# Patient Record
Sex: Male | Born: 1950 | Race: White | Hispanic: No | Marital: Single
Health system: Southern US, Community
[De-identification: ages and names within clinical notes are randomized; demographics above are authoritative.]

---

## 2013-12-29 ENCOUNTER — Other Ambulatory Visit (HOSPITAL_COMMUNITY): Payer: Self-pay | Admitting: *Deleted

## 2013-12-29 DIAGNOSIS — Z720 Tobacco use: Secondary | ICD-10-CM

## 2013-12-29 DIAGNOSIS — R9389 Abnormal findings on diagnostic imaging of other specified body structures: Secondary | ICD-10-CM

## 2014-01-01 ENCOUNTER — Ambulatory Visit (HOSPITAL_COMMUNITY)
Admission: RE | Admit: 2014-01-01 | Discharge: 2014-01-01 | Disposition: A | Discharge status: Home / Self Care | Payer: Medicare Other | Source: Ambulatory Visit | Attending: Nurse Practitioner | Admitting: Nurse Practitioner

## 2014-01-01 DIAGNOSIS — R059 Cough, unspecified: Secondary | ICD-10-CM | POA: Insufficient documentation

## 2014-01-01 DIAGNOSIS — R0989 Other specified symptoms and signs involving the circulatory and respiratory systems: Secondary | ICD-10-CM | POA: Insufficient documentation

## 2014-01-01 DIAGNOSIS — R079 Chest pain, unspecified: Secondary | ICD-10-CM | POA: Insufficient documentation

## 2014-01-01 DIAGNOSIS — W19XXXA Unspecified fall, initial encounter: Secondary | ICD-10-CM | POA: Insufficient documentation

## 2014-01-01 DIAGNOSIS — R9389 Abnormal findings on diagnostic imaging of other specified body structures: Secondary | ICD-10-CM

## 2014-01-01 DIAGNOSIS — R05 Cough: Secondary | ICD-10-CM | POA: Insufficient documentation

## 2014-01-01 DIAGNOSIS — R937 Abnormal findings on diagnostic imaging of other parts of musculoskeletal system: Secondary | ICD-10-CM | POA: Insufficient documentation

## 2014-01-01 DIAGNOSIS — Z720 Tobacco use: Secondary | ICD-10-CM

## 2015-12-20 ENCOUNTER — Encounter: Payer: Self-pay | Admitting: *Deleted

## 2016-06-08 ENCOUNTER — Other Ambulatory Visit (HOSPITAL_COMMUNITY): Payer: Self-pay | Admitting: Nurse Practitioner

## 2016-06-08 DIAGNOSIS — Z0000 Encounter for general adult medical examination without abnormal findings: Secondary | ICD-10-CM

## 2016-07-26 ENCOUNTER — Other Ambulatory Visit (HOSPITAL_COMMUNITY): Payer: Self-pay | Admitting: Nurse Practitioner

## 2016-07-26 ENCOUNTER — Other Ambulatory Visit: Payer: Self-pay | Admitting: Nurse Practitioner

## 2016-07-26 DIAGNOSIS — K7689 Other specified diseases of liver: Secondary | ICD-10-CM

## 2016-08-01 ENCOUNTER — Ambulatory Visit (HOSPITAL_COMMUNITY): Payer: Self-pay

## 2016-08-01 ENCOUNTER — Telehealth (HOSPITAL_COMMUNITY): Payer: Self-pay | Admitting: *Deleted

## 2016-08-01 NOTE — Telephone Encounter (Signed)
spoke with patient, he said he has liver cancer and he has other things he is concerned with right now.   He said he won't be needing appointment here.

## 2016-08-03 ENCOUNTER — Ambulatory Visit (HOSPITAL_COMMUNITY)
Admission: RE | Admit: 2016-08-03 | Discharge: 2016-08-03 | Disposition: A | Discharge status: Home / Self Care | Payer: Medicare Other | Source: Ambulatory Visit | Attending: Nurse Practitioner | Admitting: Nurse Practitioner

## 2016-08-03 DIAGNOSIS — K7689 Other specified diseases of liver: Secondary | ICD-10-CM | POA: Insufficient documentation

## 2016-08-03 DIAGNOSIS — K824 Cholesterolosis of gallbladder: Secondary | ICD-10-CM | POA: Diagnosis not present

## 2018-04-21 IMAGING — US US ABDOMEN COMPLETE
1 series · 14 of 25 positions shown · non-contrast
Comparison: CT 01/01/2014.

CLINICAL DATA: Hepatic cyst follow-up.

EXAM:
ABDOMEN ULTRASOUND COMPLETE

[Series 1: us abdomen complete · 0.22mm/px · 14 of 91 slices shown]
[im 1/91]
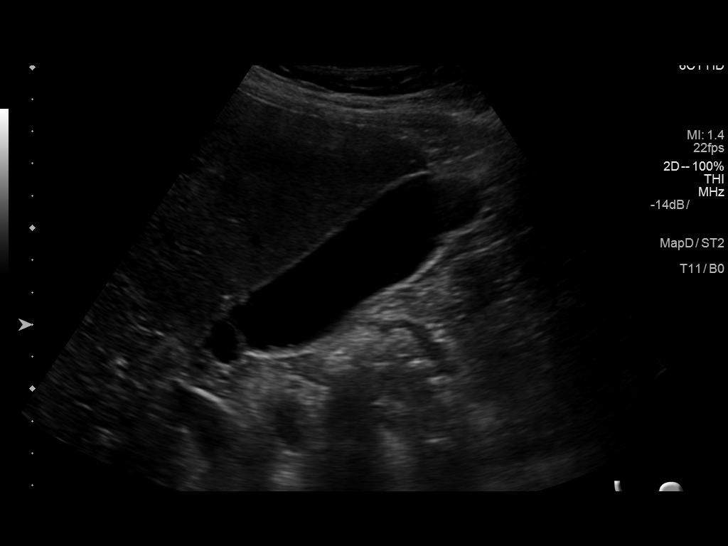
[im 8/91]
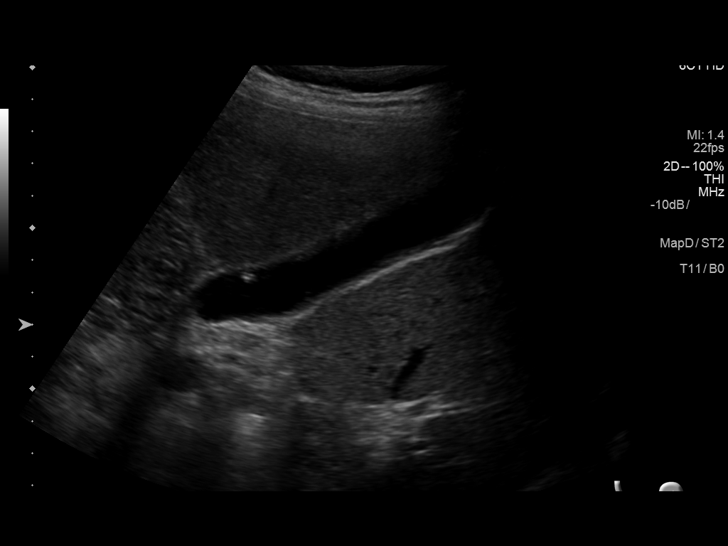
[im 16/91]
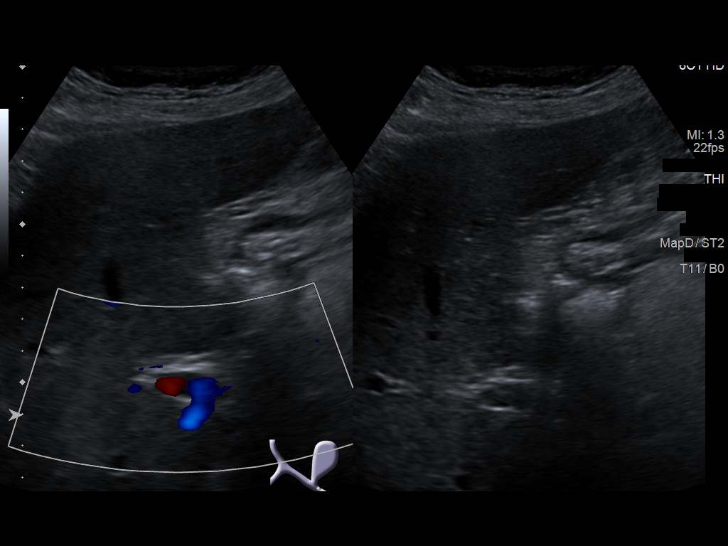
[im 23/91]
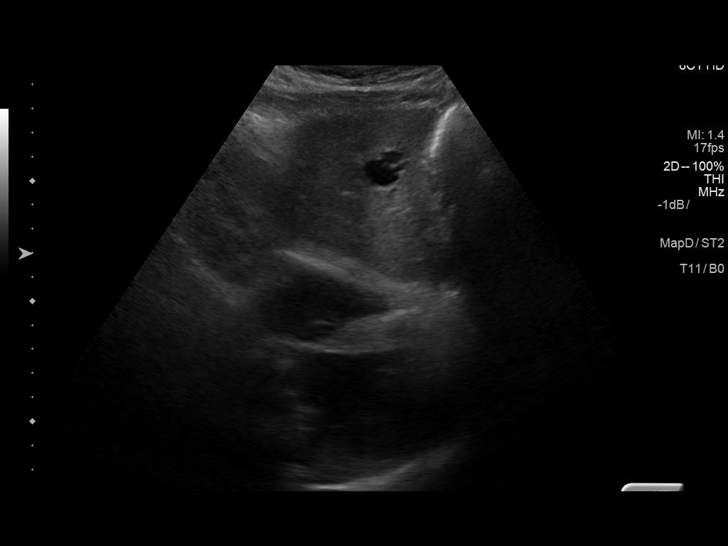
[im 31/91]
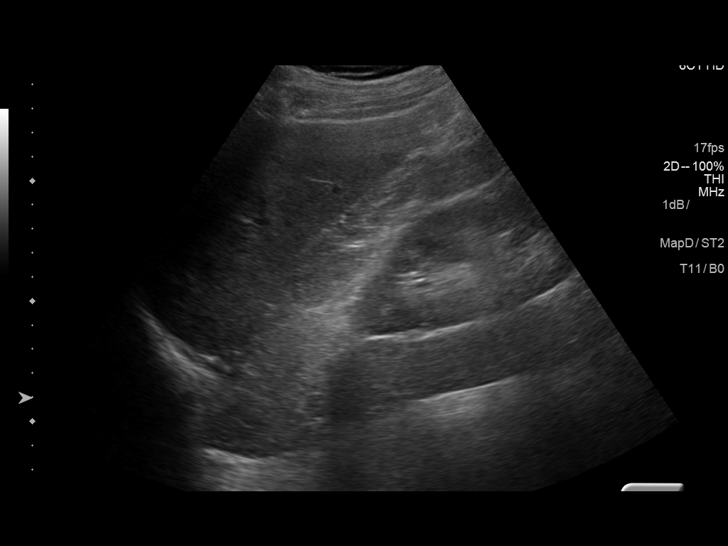
[im 34/91]
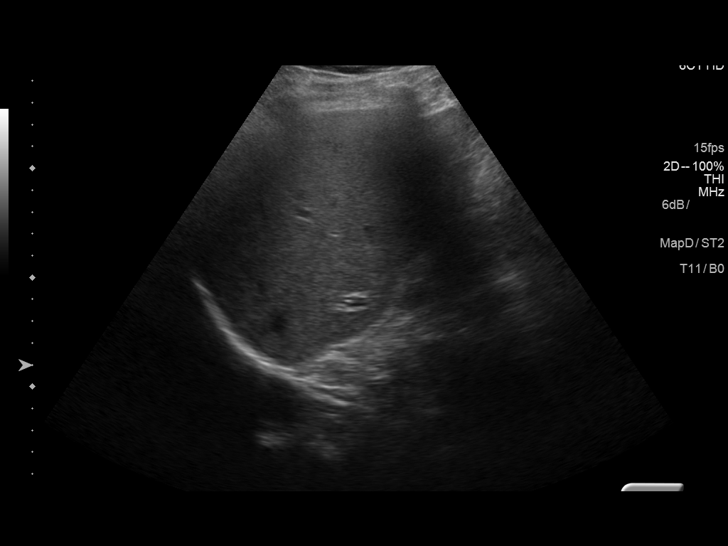
[im 42/91]
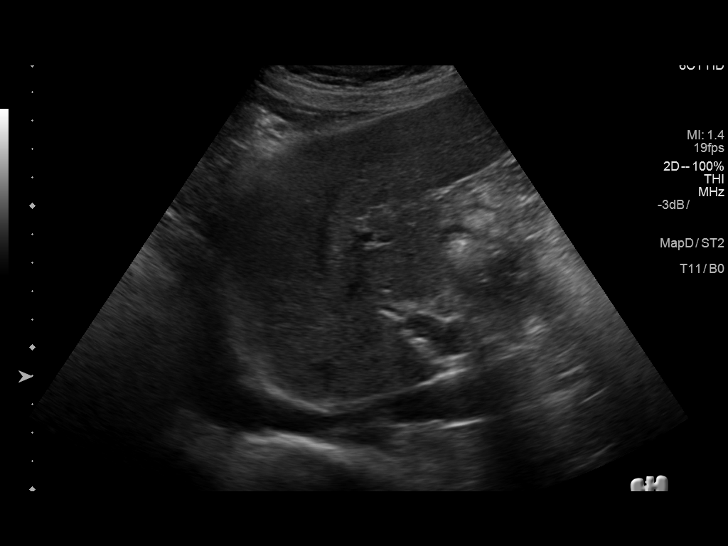
[im 49/91]
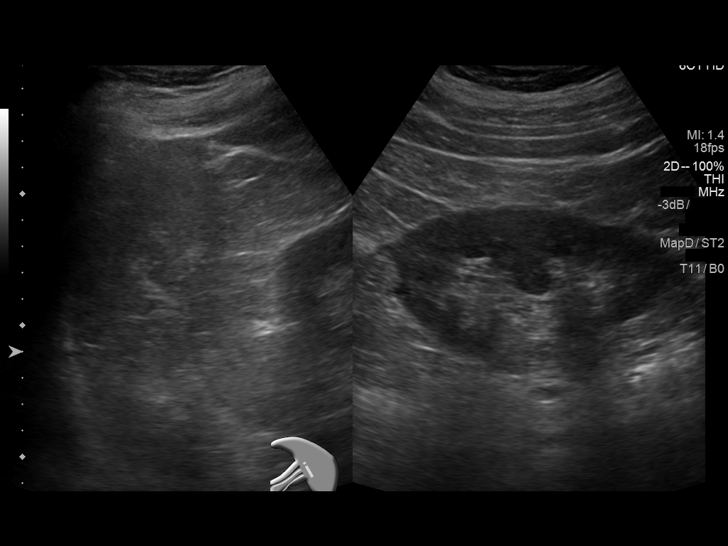
[im 57/91]
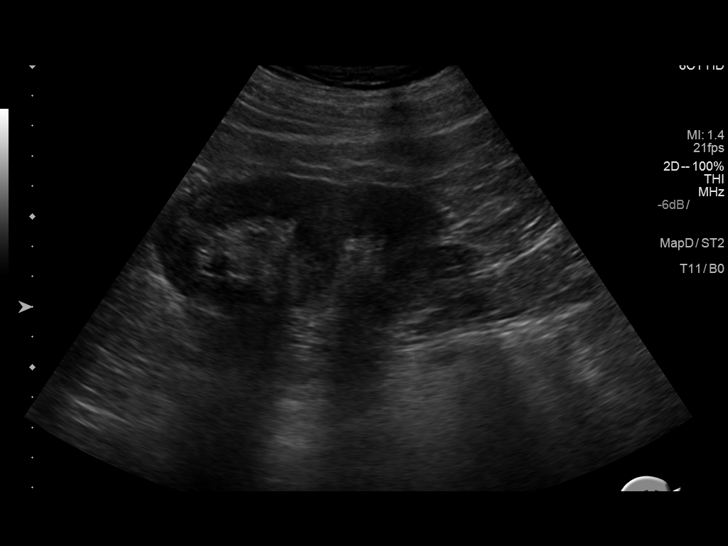
[im 61/91]
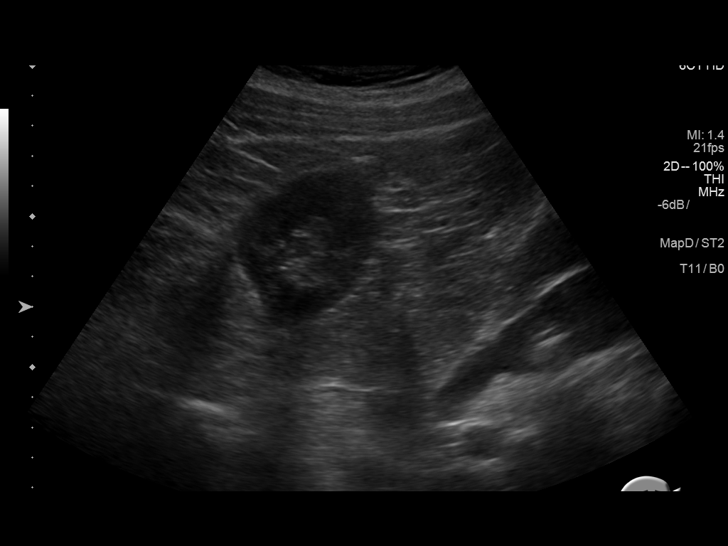
[im 68/91]
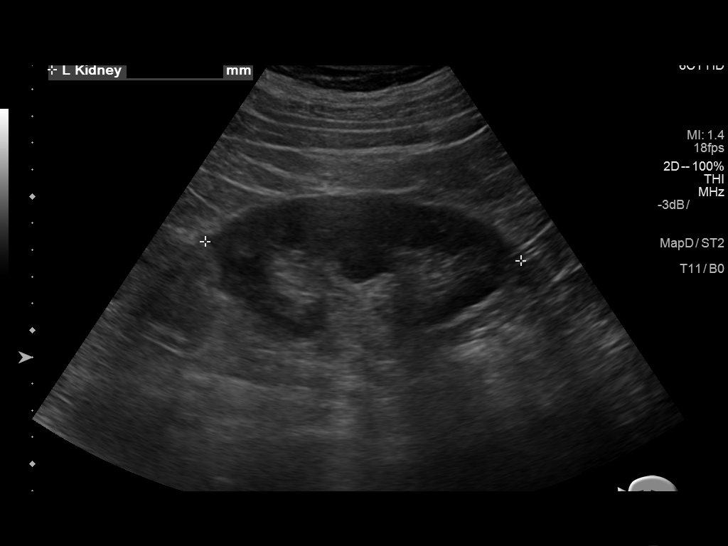
[im 76/91]
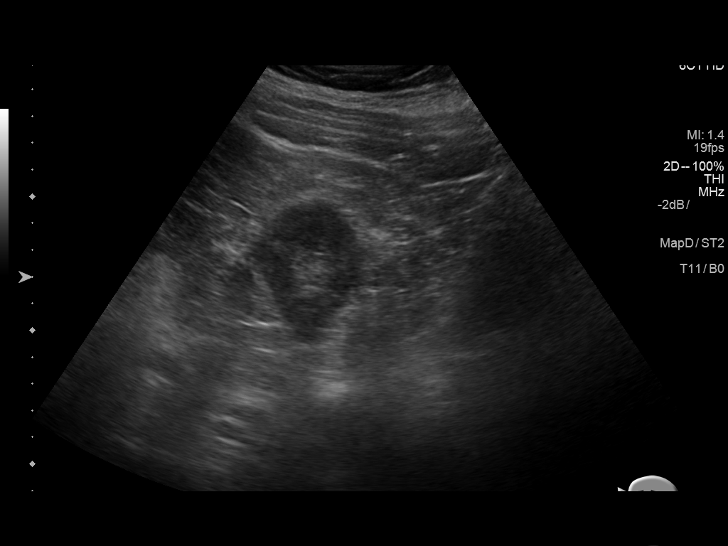
[im 83/91]
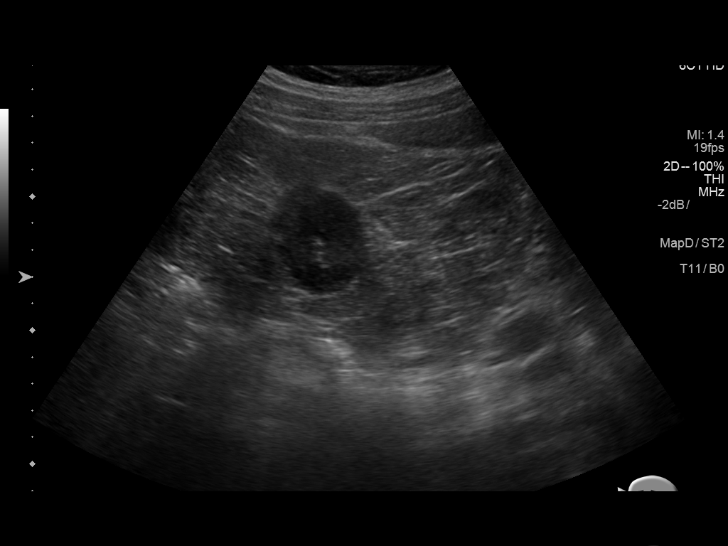
[im 91/91]
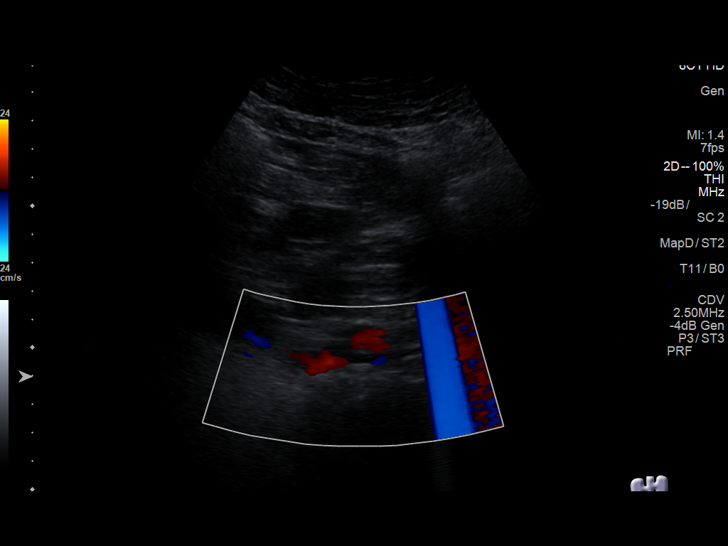

[14 of 25 positions shown; findings below may reference images not displayed]

FINDINGS: Gallbladder: Tiny 6 mm non shadowing non mobile echodensity noted
along the anterior gallbladder wall consistent with a polyp. No
gallstones. Negative Murphy sign.

Common bile duct: Diameter: 4 mm

Liver: Liver echotexture is normal. 1.9 cm and 2.0 cm cysts with
thin septations noted in the anterior aspect of the left lobe liver.

IVC: No abnormality visualized.

Pancreas: Visualized portion unremarkable.

Spleen: Size and appearance within normal limits.

Right Kidney: Length: 11.4 cm. Echogenicity within normal limits. No
mass or hydronephrosis visualized.

Left Kidney: Length: 11.8 cm. Echogenicity within normal limits. No
mass or hydronephrosis visualized.

Abdominal aorta: No aneurysm visualized.

Other findings: None.
IMPRESSION: 1. 6 mm gallbladder polyp.  No gallstones or biliary distention.

2. 2 small septated cysts in the left lobe liver again noted. These
are most likely benign. Similar findings noted on prior CT of
01/01/2014 .

## 2021-09-21 ENCOUNTER — Encounter: Payer: Medicare Other | Admitting: Podiatry

## 2021-09-21 ENCOUNTER — Ambulatory Visit: Payer: Medicare Other

## 2021-09-22 NOTE — Progress Notes (Signed)
This encounter was created in error - please disregard.

## 2023-07-10 ENCOUNTER — Ambulatory Visit: Payer: Medicare Other | Admitting: Podiatry

## 2023-07-10 DIAGNOSIS — Z0000 Encounter for general adult medical examination without abnormal findings: Secondary | ICD-10-CM

## 2023-07-10 DIAGNOSIS — Q666 Other congenital valgus deformities of feet: Secondary | ICD-10-CM | POA: Diagnosis not present

## 2023-07-10 NOTE — Progress Notes (Signed)
Subjective: Ryan Rocha presents today referred by Erasmo Downer, NP for  foot evaluation.    Patient denies any history of foot wounds.  Patient denies any history of numbness, tingling, burning, pins/needles sensations.  No past medical history on file.  There are no problems to display for this patient.     No current outpatient medications on file prior to visit.   No current facility-administered medications on file prior to visit.     Not on File  Social History   Occupational History   Not on file  Tobacco Use   Smoking status: Not on file   Smokeless tobacco: Not on file  Substance and Sexual Activity   Alcohol use: Not on file   Drug use: Not on file   Sexual activity: Not on file    No family history on file.   There is no immunization history on file for this patient.  Review of systems: Positive Findings in bold print.  Constitutional:  chills, fatigue, fever, sweats, weight change Communication: Nurse, learning disability, sign Presenter, broadcasting, hand writing, iPad/Android device Head: headaches, head injury Eyes: changes in vision, eye pain, glaucoma, cataracts, macular degeneration, diplopia, glare,  light sensitivity, eyeglasses or contacts, blindness Ears nose mouth throat: hearing impaired, hearing aids,  ringing in ears, deaf, sign language,  vertigo, nosebleeds,  rhinitis,  cold sores, snoring, swollen glands Cardiovascular: HTN, edema, arrhythmia, pacemaker in place, defibrillator in place, chest pain/tightness, chronic anticoagulation, blood clot, heart failure, MI Peripheral Vascular: leg cramps, varicose veins, blood clots, lymphedema, varicosities Respiratory:  asthma, difficulty breathing, denies congestion, SOB, wheezing, cough, emphysema Gastrointestinal: change in appetite or weight, abdominal pain, constipation, diarrhea, nausea, vomiting, vomiting blood, change in bowel habits, abdominal pain, jaundice, rectal bleeding, hemorrhoids,  GERD Genitourinary:  nocturia,  pain on urination, polyuria,  blood in urine, Foley catheter, urinary urgency, ESRD on hemodialysis Musculoskeletal: amputation, cramping, stiff joints, painful joints, decreased joint motion, fractures, OA, gout, hemiplegia, paraplegia, uses cane, wheelchair bound, uses walker, uses rollator Skin: +changes in toenails, color change, dryness, itching, mole changes,  rash, wound(s) Neurological: headaches, numbness in feet, paresthesias in feet, burning in feet, fainting,  seizures, change in speech, migraines, memory problems/poor historian, cerebral palsy, weakness, paralysis, CVA, TIA Endocrine: diabetes, hypothyroidism, hyperthyroidism,  goiter, dry mouth, flushing, heat intolerance, cold intolerance,  excessive thirst, denies polyuria,  nocturia Hematological:  easy bleeding, excessive bleeding, easy bruising, enlarged lymph nodes, on long term blood thinner, history of past transusions Allergy/immunological:  hives, eczema, frequent infections, multiple drug allergies, seasonal allergies, transplant recipient, multiple food allergies Psychiatric:  anxiety, depression, mood disorder, suicidal ideations, hallucinations, insomnia  Objective: There were no vitals filed for this visit. Vascular Examination: Capillary refill time less than 3 seconds x 10 digits.  Dorsalis pedis pulses less than 3 seconds.  Posterior tibial pulses less than 3 seconds.  Digital hair not present x 10 digits.  Skin temperature gradient WNL b/l.  Dermatological Examination: Skin with normal turgor, texture and tone b/l  Toenails 1-5 b/l discolored, thick, dystrophic with subungual debris and pain with palpation to nailbeds due to thickness of nails.  Musculoskeletal: Muscle strength 5/5 to all LE muscle groups.  Pes planus foot structure noted without any deformities  Neurological: Sensation intact with 10 gram monofilament.  Vibratory sensation intact.  Assessment: Pes  planovalgus Encounter for foot examination  Plan: Discussed diabetic foot care principles. Literature dispensed on today. Patient to continue soft, supportive shoe gear daily. Patient to report any pedal injuries to medical  professional immediately. Follow up one year. Patient/POA to call should there be a concern in the interim.

## 2023-08-09 ENCOUNTER — Ambulatory Visit: Payer: Medicare Other | Admitting: Podiatry

## 2023-08-09 DIAGNOSIS — Z0000 Encounter for general adult medical examination without abnormal findings: Secondary | ICD-10-CM

## 2023-08-09 NOTE — Progress Notes (Signed)
Subjective: Ryan Rocha presents today referred by Erasmo Downer, NP for  foot evaluation.    Patient denies any history of foot wounds.  Patient denies any history of numbness, tingling, burning, pins/needles sensations.  No past medical history on file.  There are no problems to display for this patient.     Current Outpatient Medications on File Prior to Visit  Medication Sig Dispense Refill   meloxicam (MOBIC) 7.5 MG tablet 1 tablet Orally twice a day for 14 days     Respiratory Therapy Supplies (NEBULIZER/PEDIATRIC MASK) KIT See admin instructions.     albuterol (VENTOLIN HFA) 108 (90 Base) MCG/ACT inhaler INHALE 2 PUFFS BY MOUTH EVERY 4-6 HOURS AS NEEDED     atorvastatin (LIPITOR) 40 MG tablet Take 40 mg by mouth daily.     Cholecalciferol 50 MCG (2000 UT) TABS TAKE ONE TABLET BY MOUTH EVERY DAY for 28     clonazePAM (KLONOPIN) 0.5 MG tablet TAKE 1/2 TABLET BY MOUTH TWICE DAILY FOR ANXIETY Orally Twice daily as needed for anxiety for 30     escitalopram (LEXAPRO) 10 MG tablet Take 10 mg by mouth daily.     fluticasone (FLONASE ALLERGY RELIEF) 50 MCG/ACT nasal spray 2 sprays in each nostril Once a day Nasally 30 day(s)     fluticasone-salmeterol (ADVAIR) 500-50 MCG/ACT AEPB USE 1 INHALATION BY MOUTH EVERY 12 HOURS. <<RINSE MOUTH AFTER USE>>.     rOPINIRole (REQUIP) 0.5 MG tablet Take 0.5 mg by mouth 2 (two) times daily.     temazepam (RESTORIL) 30 MG capsule Take 30 mg by mouth at bedtime as needed.     No current facility-administered medications on file prior to visit.     Allergies  Allergen Reactions   Merbromin     Other Reaction(s): Unknown    Social History   Occupational History   Not on file  Tobacco Use   Smoking status: Not on file   Smokeless tobacco: Not on file  Substance and Sexual Activity   Alcohol use: Not on file   Drug use: Not on file   Sexual activity: Not on file    No family history on file.   There is no immunization history  on file for this patient.  Review of systems: Positive Findings in bold print.  Constitutional:  chills, fatigue, fever, sweats, weight change Communication: Nurse, learning disability, sign Presenter, broadcasting, hand writing, iPad/Android device Head: headaches, head injury Eyes: changes in vision, eye pain, glaucoma, cataracts, macular degeneration, diplopia, glare,  light sensitivity, eyeglasses or contacts, blindness Ears nose mouth throat: hearing impaired, hearing aids,  ringing in ears, deaf, sign language,  vertigo, nosebleeds,  rhinitis,  cold sores, snoring, swollen glands Cardiovascular: HTN, edema, arrhythmia, pacemaker in place, defibrillator in place, chest pain/tightness, chronic anticoagulation, blood clot, heart failure, MI Peripheral Vascular: leg cramps, varicose veins, blood clots, lymphedema, varicosities Respiratory:  asthma, difficulty breathing, denies congestion, SOB, wheezing, cough, emphysema Gastrointestinal: change in appetite or weight, abdominal pain, constipation, diarrhea, nausea, vomiting, vomiting blood, change in bowel habits, abdominal pain, jaundice, rectal bleeding, hemorrhoids, GERD Genitourinary:  nocturia,  pain on urination, polyuria,  blood in urine, Foley catheter, urinary urgency, ESRD on hemodialysis Musculoskeletal: amputation, cramping, stiff joints, painful joints, decreased joint motion, fractures, OA, gout, hemiplegia, paraplegia, uses cane, wheelchair bound, uses walker, uses rollator Skin: +changes in toenails, color change, dryness, itching, mole changes,  rash, wound(s) Neurological: headaches, numbness in feet, paresthesias in feet, burning in feet, fainting,  seizures, change in speech, migraines,  memory problems/poor historian, cerebral palsy, weakness, paralysis, CVA, TIA Endocrine: diabetes, hypothyroidism, hyperthyroidism,  goiter, dry mouth, flushing, heat intolerance, cold intolerance,  excessive thirst, denies polyuria,  nocturia Hematological:  easy  bleeding, excessive bleeding, easy bruising, enlarged lymph nodes, on long term blood thinner, history of past transusions Allergy/immunological:  hives, eczema, frequent infections, multiple drug allergies, seasonal allergies, transplant recipient, multiple food allergies Psychiatric:  anxiety, depression, mood disorder, suicidal ideations, hallucinations, insomnia  Objective: There were no vitals filed for this visit. Vascular Examination: Capillary refill time less than 3 seconds x 10 digits.  Dorsalis pedis pulses less than 3 seconds.  Posterior tibial pulses less than 3 seconds.  Digital hair not present x 10 digits.  Skin temperature gradient WNL b/l.  Dermatological Examination: Skin with normal turgor, texture and tone b/l  Toenails 1-5 b/l discolored, thick, dystrophic with subungual debris and pain with palpation to nailbeds due to thickness of nails.  Musculoskeletal: Muscle strength 5/5 to all LE muscle groups.  Pes planus foot structure noted without any deformities  Neurological: Sensation intact with 10 gram monofilament.  Vibratory sensation intact.  Assessment: Pes planovalgus Encounter for foot examination  Plan: Discussed diabetic foot care principles. Literature dispensed on today. Patient to continue soft, supportive shoe gear daily. Patient to report any pedal injuries to medical professional immediately. Follow up one year. Patient/POA to call should there be a concern in the interim.

## 2024-11-18 ENCOUNTER — Encounter: Payer: Self-pay | Admitting: Podiatry

## 2024-11-18 ENCOUNTER — Ambulatory Visit: Admitting: Podiatry

## 2024-11-18 DIAGNOSIS — Q666 Other congenital valgus deformities of feet: Secondary | ICD-10-CM

## 2024-11-18 DIAGNOSIS — Z0000 Encounter for general adult medical examination without abnormal findings: Secondary | ICD-10-CM

## 2024-11-18 NOTE — Progress Notes (Signed)
 Subjective: Ryan Rocha presents today referred by Johnson Morna FALCON, NP for  foot evaluation.    Patient denies any history of foot wounds.  Patient denies any history of numbness, tingling, burning, pins/needles sensations.  No past medical history on file.  There are no active problems to display for this patient.     Current Outpatient Medications on File Prior to Visit  Medication Sig Dispense Refill   albuterol (VENTOLIN HFA) 108 (90 Base) MCG/ACT inhaler INHALE 2 PUFFS BY MOUTH EVERY 4-6 HOURS AS NEEDED     atorvastatin (LIPITOR) 40 MG tablet Take 40 mg by mouth daily.     Cholecalciferol 50 MCG (2000 UT) TABS TAKE ONE TABLET BY MOUTH EVERY DAY for 28     clonazePAM (KLONOPIN) 0.5 MG tablet TAKE 1/2 TABLET BY MOUTH TWICE DAILY FOR ANXIETY Orally Twice daily as needed for anxiety for 30     escitalopram (LEXAPRO) 10 MG tablet Take 10 mg by mouth daily.     fluticasone (FLONASE ALLERGY RELIEF) 50 MCG/ACT nasal spray 2 sprays in each nostril Once a day Nasally 30 day(s)     fluticasone-salmeterol (ADVAIR) 500-50 MCG/ACT AEPB USE 1 INHALATION BY MOUTH EVERY 12 HOURS. <<RINSE MOUTH AFTER USE>>.     meloxicam (MOBIC) 7.5 MG tablet 1 tablet Orally twice a day for 14 days     Respiratory Therapy Supplies (NEBULIZER/PEDIATRIC MASK) KIT See admin instructions.     rOPINIRole (REQUIP) 0.5 MG tablet Take 0.5 mg by mouth 2 (two) times daily.     temazepam (RESTORIL) 30 MG capsule Take 30 mg by mouth at bedtime as needed.     No current facility-administered medications on file prior to visit.     Allergies  Allergen Reactions   Merbromin     Other Reaction(s): Unknown    Social History   Occupational History   Not on file  Tobacco Use   Smoking status: Not on file   Smokeless tobacco: Not on file  Substance and Sexual Activity   Alcohol use: Not on file   Drug use: Not on file   Sexual activity: Not on file    No family history on file.   There is no immunization  history on file for this patient.  Review of systems: Positive Findings in bold print.  Constitutional:  chills, fatigue, fever, sweats, weight change Communication: nurse, learning disability, sign presenter, broadcasting, hand writing, iPad/Android device Head: headaches, head injury Eyes: changes in vision, eye pain, glaucoma, cataracts, macular degeneration, diplopia, glare,  light sensitivity, eyeglasses or contacts, blindness Ears nose mouth throat: hearing impaired, hearing aids,  ringing in ears, deaf, sign language,  vertigo, nosebleeds,  rhinitis,  cold sores, snoring, swollen glands Cardiovascular: HTN, edema, arrhythmia, pacemaker in place, defibrillator in place, chest pain/tightness, chronic anticoagulation, blood clot, heart failure, MI Peripheral Vascular: leg cramps, varicose veins, blood clots, lymphedema, varicosities Respiratory:  asthma, difficulty breathing, denies congestion, SOB, wheezing, cough, emphysema Gastrointestinal: change in appetite or weight, abdominal pain, constipation, diarrhea, nausea, vomiting, vomiting blood, change in bowel habits, abdominal pain, jaundice, rectal bleeding, hemorrhoids, GERD Genitourinary:  nocturia,  pain on urination, polyuria,  blood in urine, Foley catheter, urinary urgency, ESRD on hemodialysis Musculoskeletal: amputation, cramping, stiff joints, painful joints, decreased joint motion, fractures, OA, gout, hemiplegia, paraplegia, uses cane, wheelchair bound, uses walker, uses rollator Skin: +changes in toenails, color change, dryness, itching, mole changes,  rash, wound(s) Neurological: headaches, numbness in feet, paresthesias in feet, burning in feet, fainting,  seizures, change in speech,  migraines, memory problems/poor historian, cerebral palsy, weakness, paralysis, CVA, TIA Endocrine: diabetes, hypothyroidism, hyperthyroidism,  goiter, dry mouth, flushing, heat intolerance, cold intolerance,  excessive thirst, denies polyuria,   nocturia Hematological:  easy bleeding, excessive bleeding, easy bruising, enlarged lymph nodes, on long term blood thinner, history of past transusions Allergy/immunological:  hives, eczema, frequent infections, multiple drug allergies, seasonal allergies, transplant recipient, multiple food allergies Psychiatric:  anxiety, depression, mood disorder, suicidal ideations, hallucinations, insomnia  Objective: There were no vitals filed for this visit. Vascular Examination: Capillary refill time less than 3 seconds x 10 digits.  Dorsalis pedis pulses less than 3 seconds.  Posterior tibial pulses less than 3 seconds.  Digital hair not present x 10 digits.  Skin temperature gradient WNL b/l.  Dermatological Examination: Skin with normal turgor, texture and tone b/l  Toenails 1-5 b/l discolored, thick, dystrophic with subungual debris and pain with palpation to nailbeds due to thickness of nails.  Musculoskeletal: Muscle strength 5/5 to all LE muscle groups.  Pes planus foot structure noted though no gross deformities.  Pes planovalgus noted to many toe signs noted.  Neurological: Sensation intact with 10 gram monofilament.  Vibratory sensation intact.  Assessment: Pes planovalgus Encounter for foot examination  Plan: Discussed diabetic foot care principles. Literature dispensed on today. Patient to continue soft, supportive shoe gear daily. Patient to report any pedal injuries to medical professional immediately. Follow up one year. Patient/POA to call should there be a concern in the interim.  Pes planovalgus/foot deformity -I explained to patient the etiology of pes planovalgus and relationship with heel pain/arch pain and various treatment options were discussed.  Given patient foot structure in the setting of heel pain/arch pain I believe patient will benefit from custom-made orthotics to help control the hindfoot motion support the arch of the foot and take the stress away  from arches.  Patient agrees with the plan like to proceed with orthotics -Patient was casted for orthotics

## 2024-12-19 ENCOUNTER — Telehealth: Payer: Self-pay | Admitting: Podiatry

## 2024-12-19 NOTE — Telephone Encounter (Signed)
 Orthotics are in [place] office. Called patient and a person from the facility answered saying that they will give us  a call when they speak with the transportation department. Orthotics are being sent to [facility] location.

## 2025-01-01 ENCOUNTER — Telehealth: Payer: Self-pay | Admitting: Podiatry

## 2025-01-01 NOTE — Telephone Encounter (Signed)
 Orthotics in [facility] called pt LM on VM for pt to call us  to schedule appt [facility].

## 2025-02-19 ENCOUNTER — Ambulatory Visit: Admitting: Podiatry
